# Patient Record
Sex: Female | Born: 1960 | Race: White | Hispanic: No | Marital: Married | Smoking: Never smoker
Health system: Southern US, Community
[De-identification: ages and names within clinical notes are randomized; demographics above are authoritative.]

## PROBLEM LIST (undated history)

## (undated) DIAGNOSIS — G9050 Complex regional pain syndrome I, unspecified: Secondary | ICD-10-CM

## (undated) DIAGNOSIS — I7300 Raynaud's syndrome without gangrene: Secondary | ICD-10-CM

## (undated) HISTORY — PX: OTHER SURGICAL HISTORY: SHX169

## (undated) HISTORY — DX: Complex regional pain syndrome I, unspecified: G90.50

## (undated) HISTORY — DX: Raynaud's syndrome without gangrene: I73.00

## (undated) HISTORY — PX: BREAST SURGERY: SHX581

## (undated) HISTORY — PX: DILATION AND CURETTAGE OF UTERUS: SHX78

---

## 1997-04-17 ENCOUNTER — Other Ambulatory Visit: Admission: RE | Admit: 1997-04-17 | Discharge: 1997-04-17 | Payer: Self-pay | Admitting: Obstetrics and Gynecology

## 1998-08-19 ENCOUNTER — Emergency Department (HOSPITAL_COMMUNITY): Admission: EM | Admit: 1998-08-19 | Discharge: 1998-08-20 | Payer: Self-pay | Admitting: Emergency Medicine

## 1998-08-20 ENCOUNTER — Encounter: Payer: Self-pay | Admitting: Emergency Medicine

## 2001-04-15 ENCOUNTER — Other Ambulatory Visit: Admission: RE | Admit: 2001-04-15 | Discharge: 2001-04-15 | Payer: Self-pay | Admitting: Obstetrics and Gynecology

## 2001-04-22 ENCOUNTER — Encounter: Payer: Self-pay | Admitting: Obstetrics and Gynecology

## 2001-04-22 ENCOUNTER — Encounter: Admission: RE | Admit: 2001-04-22 | Discharge: 2001-04-22 | Payer: Self-pay | Admitting: Obstetrics and Gynecology

## 2001-05-30 ENCOUNTER — Emergency Department (HOSPITAL_COMMUNITY): Admission: EM | Admit: 2001-05-30 | Discharge: 2001-05-31 | Payer: Self-pay | Admitting: Emergency Medicine

## 2001-05-31 ENCOUNTER — Encounter: Payer: Self-pay | Admitting: Emergency Medicine

## 2002-04-10 ENCOUNTER — Encounter: Payer: Self-pay | Admitting: General Surgery

## 2002-04-10 ENCOUNTER — Encounter: Admission: RE | Admit: 2002-04-10 | Discharge: 2002-04-10 | Payer: Self-pay | Admitting: General Surgery

## 2002-05-29 ENCOUNTER — Emergency Department (HOSPITAL_COMMUNITY): Admission: EM | Admit: 2002-05-29 | Discharge: 2002-05-29 | Payer: Self-pay | Admitting: Emergency Medicine

## 2002-05-31 ENCOUNTER — Emergency Department (HOSPITAL_COMMUNITY): Admission: EM | Admit: 2002-05-31 | Discharge: 2002-05-31 | Payer: Self-pay | Admitting: Emergency Medicine

## 2003-04-05 ENCOUNTER — Emergency Department (HOSPITAL_COMMUNITY): Admission: EM | Admit: 2003-04-05 | Discharge: 2003-04-06 | Payer: Self-pay | Admitting: Emergency Medicine

## 2004-04-03 ENCOUNTER — Emergency Department: Payer: Self-pay | Admitting: General Practice

## 2004-04-21 ENCOUNTER — Emergency Department (HOSPITAL_COMMUNITY): Admission: EM | Admit: 2004-04-21 | Discharge: 2004-04-21 | Payer: Self-pay | Admitting: Emergency Medicine

## 2004-06-25 ENCOUNTER — Emergency Department: Payer: Self-pay | Admitting: Emergency Medicine

## 2005-09-05 IMAGING — CR DG ABDOMEN ACUTE W/ 1V CHEST
4 series · 4 of 4 positions shown · non-contrast
Comparison: none

CLINICAL DATA: Abdominal pain.
3-VIEW ACUTE ABDOMINAL SERIES WITH CHEST:

[w chest pa]
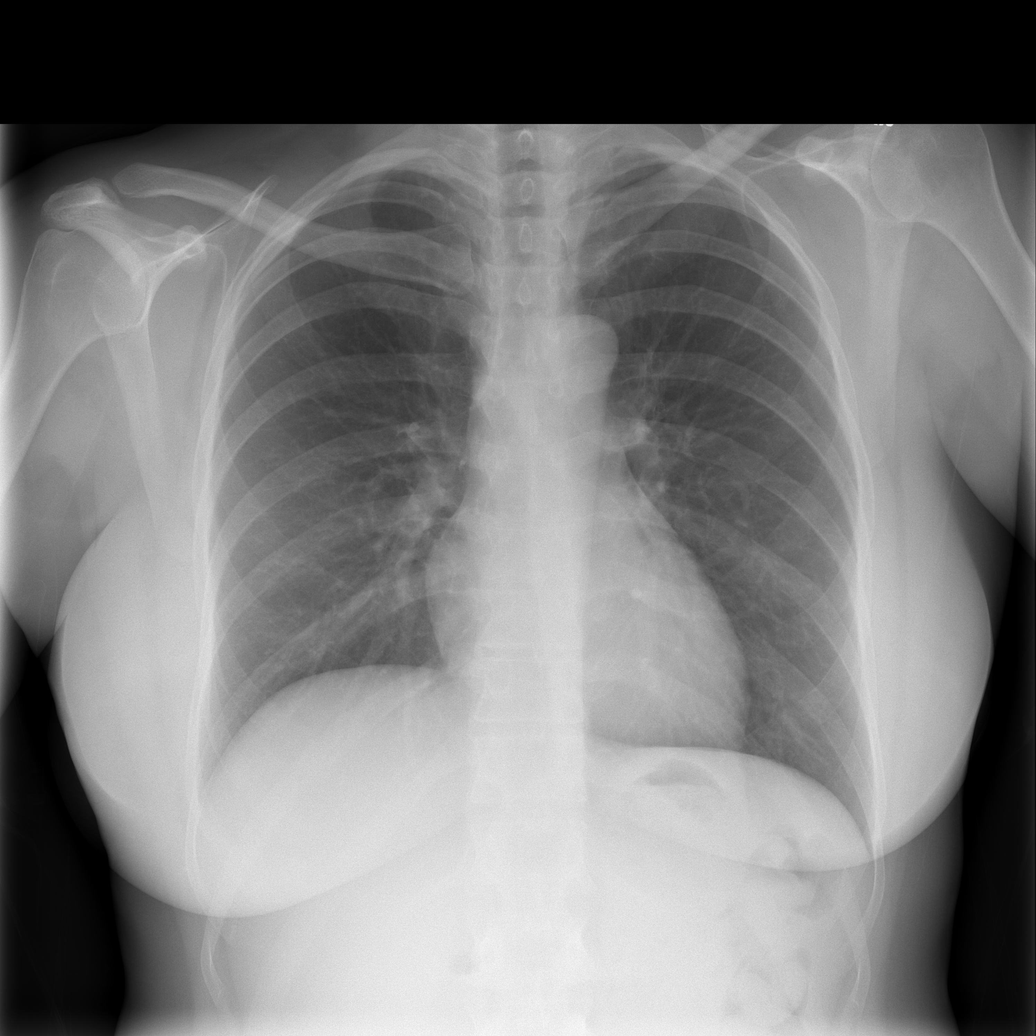

[w abdomen upright *]
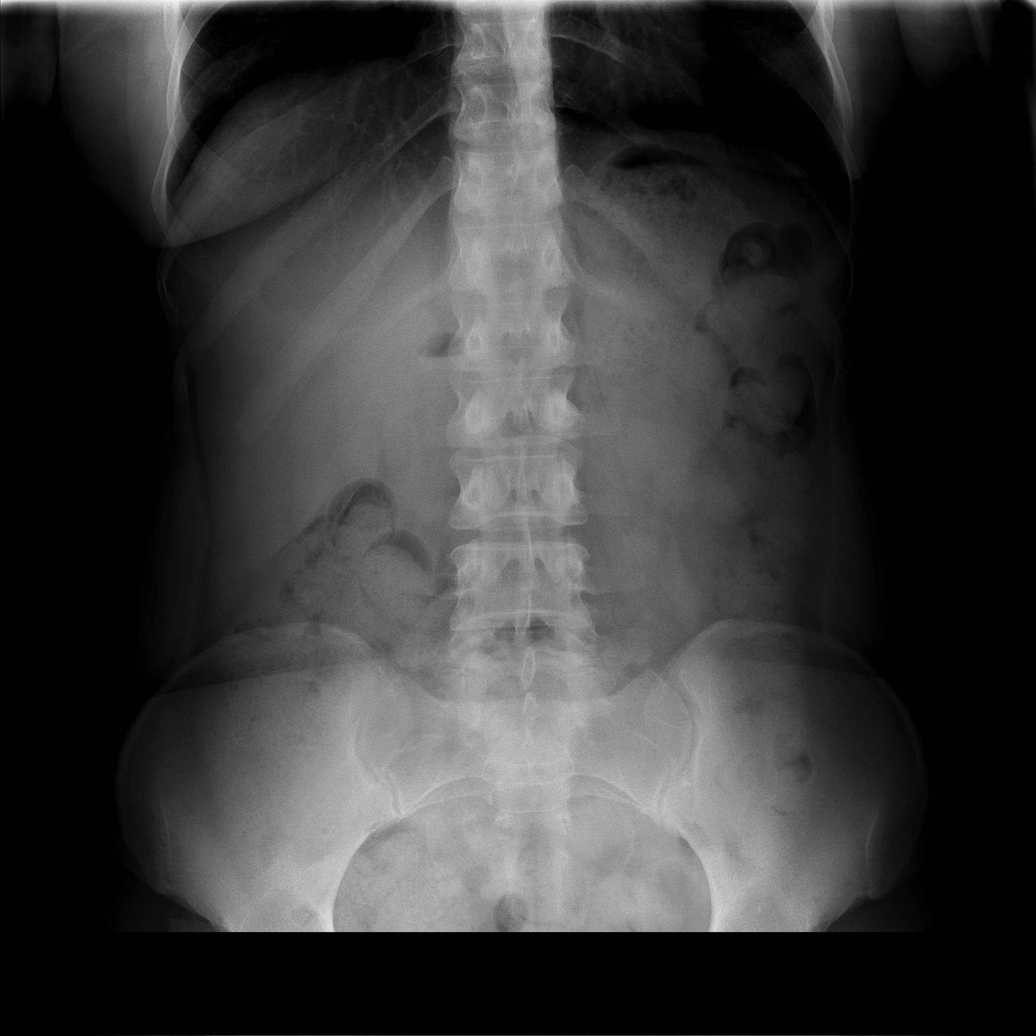

[t abdomen supine (1 of 2)]
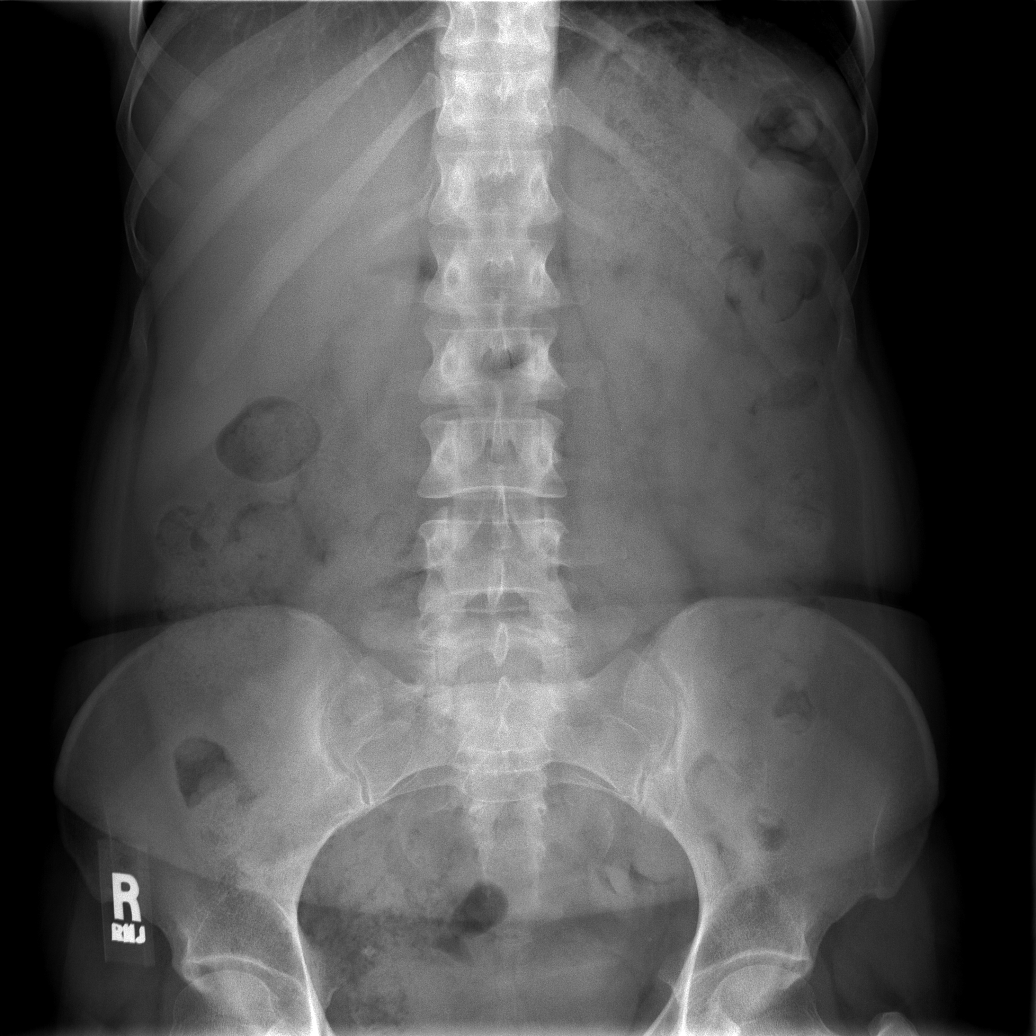

[t abdomen supine (2 of 2)]
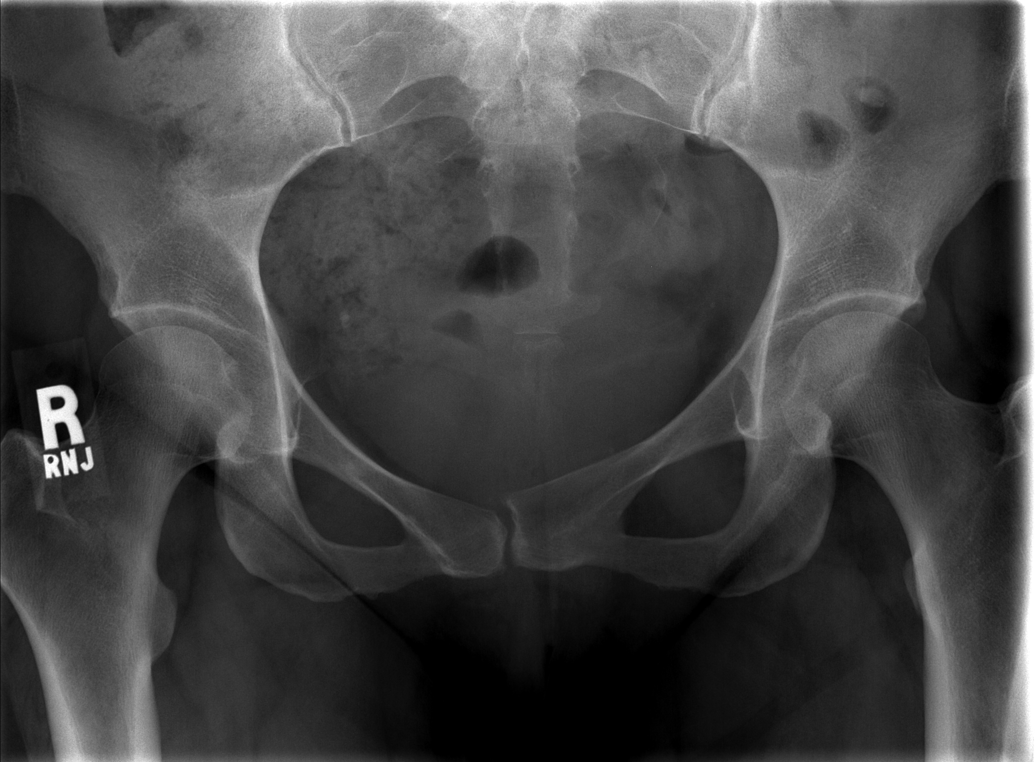

[4 of 4 positions shown; findings below may reference images not displayed]

FINDINGS: ardiomediastinal silhouette is unremarkable.  The lungs are clear.  Unremarkable bowel gas pattern with moderate colonic stool.  No evidence of bowel obstruction or pneumoperitoneum.
IMPRESSION: Moderate colonic stool. Otherwise no acute abnormality.

## 2006-09-12 ENCOUNTER — Encounter: Admission: RE | Admit: 2006-09-12 | Discharge: 2006-09-12 | Payer: Self-pay | Admitting: General Surgery

## 2006-12-20 ENCOUNTER — Other Ambulatory Visit: Admission: RE | Admit: 2006-12-20 | Discharge: 2006-12-20 | Payer: Self-pay | Admitting: Gynecology

## 2008-09-18 ENCOUNTER — Other Ambulatory Visit: Admission: RE | Admit: 2008-09-18 | Discharge: 2008-09-18 | Payer: Self-pay | Admitting: Gynecology

## 2008-09-18 ENCOUNTER — Encounter: Payer: Self-pay | Admitting: Gynecology

## 2008-09-18 ENCOUNTER — Ambulatory Visit: Payer: Self-pay | Admitting: Gynecology

## 2008-09-22 ENCOUNTER — Ambulatory Visit: Payer: Self-pay | Admitting: Gynecology

## 2008-09-25 ENCOUNTER — Ambulatory Visit: Payer: Self-pay | Admitting: Gynecology

## 2008-12-04 ENCOUNTER — Encounter: Admission: RE | Admit: 2008-12-04 | Discharge: 2008-12-04 | Payer: Self-pay | Admitting: Gynecology

## 2009-10-06 ENCOUNTER — Ambulatory Visit: Payer: Self-pay | Admitting: Gynecology

## 2009-10-06 ENCOUNTER — Encounter: Admission: RE | Admit: 2009-10-06 | Discharge: 2009-10-06 | Payer: Self-pay | Admitting: Gynecology

## 2009-10-06 ENCOUNTER — Other Ambulatory Visit: Admission: RE | Admit: 2009-10-06 | Discharge: 2009-10-06 | Payer: Self-pay | Admitting: Gynecology

## 2010-01-24 ENCOUNTER — Encounter: Payer: Self-pay | Admitting: Gynecology

## 2010-09-15 ENCOUNTER — Telehealth: Payer: Self-pay | Admitting: *Deleted

## 2010-09-15 NOTE — Telephone Encounter (Signed)
Pt called stating she needs a mammogram screening i give pt [facility] to schedule appointment.

## 2010-09-16 ENCOUNTER — Telehealth: Payer: Self-pay | Admitting: *Deleted

## 2010-09-16 NOTE — Telephone Encounter (Signed)
Pt called stating she has issues with the iud. Lm for pt to call

## 2010-10-10 ENCOUNTER — Encounter: Payer: Self-pay | Admitting: Gynecology

## 2010-10-10 DIAGNOSIS — I7300 Raynaud's syndrome without gangrene: Secondary | ICD-10-CM | POA: Insufficient documentation

## 2010-10-13 ENCOUNTER — Ambulatory Visit (INDEPENDENT_AMBULATORY_CARE_PROVIDER_SITE_OTHER): Payer: Managed Care, Other (non HMO) | Admitting: Gynecology

## 2010-10-13 ENCOUNTER — Other Ambulatory Visit (HOSPITAL_COMMUNITY)
Admission: RE | Admit: 2010-10-13 | Discharge: 2010-10-13 | Disposition: A | Discharge status: Home / Self Care | Payer: Managed Care, Other (non HMO) | Source: Ambulatory Visit | Attending: Gynecology | Admitting: Gynecology

## 2010-10-13 ENCOUNTER — Encounter: Payer: Self-pay | Admitting: Gynecology

## 2010-10-13 VITALS — BP 132/84 | Ht 69.0 in | Wt 160.0 lb

## 2010-10-13 DIAGNOSIS — N760 Acute vaginitis: Secondary | ICD-10-CM

## 2010-10-13 DIAGNOSIS — B373 Candidiasis of vulva and vagina: Secondary | ICD-10-CM

## 2010-10-13 DIAGNOSIS — A499 Bacterial infection, unspecified: Secondary | ICD-10-CM

## 2010-10-13 DIAGNOSIS — Z131 Encounter for screening for diabetes mellitus: Secondary | ICD-10-CM

## 2010-10-13 DIAGNOSIS — Z01419 Encounter for gynecological examination (general) (routine) without abnormal findings: Secondary | ICD-10-CM

## 2010-10-13 DIAGNOSIS — N898 Other specified noninflammatory disorders of vagina: Secondary | ICD-10-CM

## 2010-10-13 DIAGNOSIS — R82998 Other abnormal findings in urine: Secondary | ICD-10-CM

## 2010-10-13 DIAGNOSIS — N949 Unspecified condition associated with female genital organs and menstrual cycle: Secondary | ICD-10-CM

## 2010-10-13 DIAGNOSIS — Z13220 Encounter for screening for lipoid disorders: Secondary | ICD-10-CM

## 2010-10-13 DIAGNOSIS — N938 Other specified abnormal uterine and vaginal bleeding: Secondary | ICD-10-CM

## 2010-10-13 MED ORDER — METRONIDAZOLE 500 MG PO TABS: 500.0000 mg | ORAL_TABLET | Freq: Two times a day (BID) | ORAL | Status: AC

## 2010-10-13 MED ORDER — TERCONAZOLE 0.4 % VA CREA: 1.0000 | TOPICAL_CREAM | Freq: Every day | VAGINAL | Status: AC

## 2010-10-13 NOTE — Progress Notes (Signed)
Tammie Russell 28-Sep-1960 [id number]        50 y.o.  for annual exam.  Mirena IUD contraception. She notes that the last 3 months spotting almost on a daily basis. She's worn tampons daily for the past 3 months patient notes also cracking around the entrance to her vagina which is making intercourse difficult.  Past medical history,surgical history, medications, allergies, family history and social history were all reviewed and documented in the EPIC chart. ROS:  Was performed and pertinent positives and negatives are included in the history.  Exam: chaperone present Filed Vitals:   10/13/10 1410  BP: 132/84   General appearance  Normal Skin grossly normal Head/Neck normal with no cervical or supraclavicular adenopathy thyroid normal Lungs  clear Cardiac RR, without RMG Abdominal  soft, nontender, without masses, organomegaly or hernia Breasts  examined lying and sitting without masses, retractions, discharge or axillary adenopathy. Pelvic  Ext/BUS/vagina  normal with white discharge noted, KOH wet prep done  Cervix  normal  IUD string visualized, Pap smear done  Uterus  anteverted, normal size, shape and contour, midline and mobile nontender   Adnexa  Without masses or tenderness    Anus and perineum  normal with external hemorrhoids noted  Rectovaginal  Deferred at patient request the to her external hemorrhoids.    Assessment/Plan:  50 y.o. female for annual exam.    1. Dub. I think her spotting is due to chronic yeast and BV which was positive on her wet prep. Also daily use of tampons for 3 months certainly is irritative. I asked her to stop using the tampons and to use Terazol 7 cream nightly for a week and Flagyl 500 twice a day x7 days alcohol avoidance reviewed. If her spotting continues she knows to call and will start with ultrasound, look at the IUD and endometrial echo. I also ordered a TSH. If spotting clears then will follow expectantly. 2. IUD.  Was placed September  2010. Doing well with this from contraceptive standpoint we'll continue through the 5 your use. 3. Health maintenance. Self breast exams on a monthly basis discussed and urged. She has mammography scheduled today we'll follow for this. Baseline CBC urinalysis glucose lipid profile ordered. Assuming #1 resolves then we'll follow up annually.    Dara Lords MD, 3:08 PM 10/13/2010

## 2010-10-13 NOTE — Patient Instructions (Signed)
Use medications as prescribed.  

## 2010-10-14 ENCOUNTER — Telehealth: Payer: Self-pay | Admitting: Gynecology

## 2010-10-14 DIAGNOSIS — N390 Urinary tract infection, site not specified: Secondary | ICD-10-CM

## 2010-10-14 MED ORDER — SULFAMETHOXAZOLE-TMP DS 800-160 MG PO TABS: 1.0000 | ORAL_TABLET | Freq: Two times a day (BID) | ORAL | Status: AC

## 2010-10-14 NOTE — Telephone Encounter (Signed)
Pt called saying pharmacy never got Terazol and flagyl Rx. Both Rx called in to [facility] pharmacy. Pt informed of this and lab results from last office visit.

## 2010-10-14 NOTE — Telephone Encounter (Signed)
Pt informed with the below note, rx called in to [facility] pharmacy.

## 2010-10-14 NOTE — Telephone Encounter (Signed)
Tell patient cholesterol and LDL are elevated and I want her to check a fasting lipid profile. She also had some bacteria grow in her urine and I would cover her with a short course of antibiotics in addition to which she is taking for the vaginal infection. Septra DS 1 by mouth twice a day x3 days ordered.

## 2010-10-14 NOTE — Telephone Encounter (Signed)
Lm for pt to call

## 2010-10-17 ENCOUNTER — Telehealth: Payer: Self-pay | Admitting: *Deleted

## 2010-10-17 NOTE — Telephone Encounter (Signed)
Pt called stating her 3 rx given on recent office visit is working great. She did have a question about the Terazol cream. Pt said that it made her feel very damp and was it normal that some of the cream leaks out of her vagina. Pt informed that this is a normal feeling and that some of the cream will come out due to body temperature. Pt was okay with this, she will follow up if any discharge should recur after taking the rx prescribed.

## 2010-10-18 ENCOUNTER — Telehealth: Payer: Self-pay | Admitting: *Deleted

## 2010-10-18 NOTE — Telephone Encounter (Signed)
Pt called with question about flagyl & septra ds. All questions answered for pt.

## 2010-10-21 ENCOUNTER — Encounter: Payer: Self-pay | Admitting: Gynecology

## 2010-10-27 ENCOUNTER — Encounter: Payer: Self-pay | Admitting: Gynecology

## 2010-10-27 ENCOUNTER — Ambulatory Visit (INDEPENDENT_AMBULATORY_CARE_PROVIDER_SITE_OTHER): Payer: Managed Care, Other (non HMO) | Admitting: Gynecology

## 2010-10-27 VITALS — BP 120/82 | Wt 157.0 lb

## 2010-10-27 DIAGNOSIS — B373 Candidiasis of vulva and vagina: Secondary | ICD-10-CM

## 2010-10-27 DIAGNOSIS — N898 Other specified noninflammatory disorders of vagina: Secondary | ICD-10-CM

## 2010-10-27 MED ORDER — FLUCONAZOLE 200 MG PO TABS: 200.0000 mg | ORAL_TABLET | Freq: Every day | ORAL | Status: AC

## 2010-10-27 NOTE — Progress Notes (Signed)
Patient presents in follow up from recent visit to being treated for BV and yeast causing a discharge and some spotting. Her symptoms have totally resolved she feels well and just wanted it checked to make sure.  Exam Pelvic external BUS vagina normal cervix normal bimanual uterus normal size midline mobile nontender adnexa without masses or tenderness  Wet prep positive for yeast  Assessment and plan resolving vulvovaginitis still has some yeast we'll cover with Diflucan 200 daily x3 days we'll follow if she develops any symptoms or recurrence.

## 2010-10-28 ENCOUNTER — Other Ambulatory Visit: Payer: Self-pay | Admitting: Dermatology

## 2010-11-03 ENCOUNTER — Encounter: Payer: Self-pay | Admitting: Family Medicine

## 2010-11-03 ENCOUNTER — Other Ambulatory Visit (INDEPENDENT_AMBULATORY_CARE_PROVIDER_SITE_OTHER): Payer: Managed Care, Other (non HMO)

## 2010-11-03 DIAGNOSIS — A35 Other tetanus: Secondary | ICD-10-CM

## 2010-11-03 DIAGNOSIS — Z23 Encounter for immunization: Secondary | ICD-10-CM

## 2011-02-13 ENCOUNTER — Telehealth: Payer: Self-pay | Admitting: *Deleted

## 2011-02-13 NOTE — Telephone Encounter (Signed)
Pt called with vaginal irritation and vaginal tears requested pills over the phone. I advised with new protocol needs OV since hasnt been seen since Oct 2012. Pt said she would treat over the counter.

## 2011-12-21 ENCOUNTER — Other Ambulatory Visit (HOSPITAL_COMMUNITY)
Admission: RE | Admit: 2011-12-21 | Discharge: 2011-12-21 | Disposition: A | Discharge status: Home / Self Care | Payer: Managed Care, Other (non HMO) | Source: Ambulatory Visit | Attending: Gynecology | Admitting: Gynecology

## 2011-12-21 ENCOUNTER — Encounter: Payer: Self-pay | Admitting: Gynecology

## 2011-12-21 ENCOUNTER — Ambulatory Visit (INDEPENDENT_AMBULATORY_CARE_PROVIDER_SITE_OTHER): Payer: Managed Care, Other (non HMO) | Admitting: Gynecology

## 2011-12-21 VITALS — BP 124/76 | Ht 69.0 in | Wt 145.0 lb

## 2011-12-21 DIAGNOSIS — Z1151 Encounter for screening for human papillomavirus (HPV): Secondary | ICD-10-CM | POA: Insufficient documentation

## 2011-12-21 DIAGNOSIS — Z13220 Encounter for screening for lipoid disorders: Secondary | ICD-10-CM

## 2011-12-21 DIAGNOSIS — Z01419 Encounter for gynecological examination (general) (routine) without abnormal findings: Secondary | ICD-10-CM | POA: Insufficient documentation

## 2011-12-21 DIAGNOSIS — R634 Abnormal weight loss: Secondary | ICD-10-CM

## 2011-12-21 LAB — COMPREHENSIVE METABOLIC PANEL
ALT: 14 U/L (ref 0–35)
AST: 16 U/L (ref 0–37)
Alkaline Phosphatase: 52 U/L (ref 39–117)
Sodium: 142 mEq/L (ref 135–145)
Total Bilirubin: 0.6 mg/dL (ref 0.3–1.2)
Total Protein: 7.1 g/dL (ref 6.0–8.3)

## 2011-12-21 LAB — CBC WITH DIFFERENTIAL/PLATELET
Basophils Relative: 0 % (ref 0–1)
Eosinophils Absolute: 0 10*3/uL (ref 0.0–0.7)
Lymphs Abs: 1.8 10*3/uL (ref 0.7–4.0)
MCH: 28.7 pg (ref 26.0–34.0)
MCHC: 34.2 g/dL (ref 30.0–36.0)
Neutrophils Relative %: 56 % (ref 43–77)
Platelets: 239 10*3/uL (ref 150–400)
RBC: 4.32 MIL/uL (ref 3.87–5.11)

## 2011-12-21 LAB — LIPID PANEL
LDL Cholesterol: 110 mg/dL — ABNORMAL HIGH (ref 0–99)
VLDL: 12 mg/dL (ref 0–40)

## 2011-12-21 NOTE — Patient Instructions (Signed)
Check My Chart web site for lab results Follow up in one year for annual exam

## 2011-12-21 NOTE — Progress Notes (Signed)
Tammie Russell 1960/12/30 [id number]        51 y.o.  [id number] for annual exam.  Several issues noted below.  Past medical history,surgical history, medications, allergies, family history and social history were all reviewed and documented in the EPIC chart. ROS:  Was performed and pertinent positives and negatives are included in the history.  Exam: Sherrilyn Rist assistant Filed Vitals:   12/21/11 1506  BP: 124/76  Height: 5\' 9"  (1.753 m)  Weight: 145 lb (65.772 kg)   General appearance  Normal Skin grossly normal Head/Neck normal with no cervical or supraclavicular adenopathy thyroid normal Lungs  clear Cardiac RR, without RMG Abdominal  soft, nontender, without masses, organomegaly or hernia Breasts  examined lying and sitting without masses, retractions, discharge or axillary adenopathy. Pelvic  Ext/BUS/vagina  normal   Cervix  normal Pap/HPV  Uterus  converted, normal size, shape and contour, midline and mobile nontender   Adnexa  Without masses or tenderness    Anus and perineum  normal   Rectovaginal  normal sphincter tone without palpated masses or tenderness.    Assessment/Plan:  51 y.o. [id number] female for annual exam.   1. Mirena IUD. Placed 09/2008. Doing well with no bleeding. Continue to monitor. Not having flushes night sweats or other menopausal symptoms.  Check baseline FSH now to see where we stand. 2. History weight loss. Patient reports losing 30 pounds over the last 1-2 years and not trying. Is concerned that something bad is going on. No hair or skin changes rashes night sweats adenopathy or other constitutional symptoms such as diarrhea constipation. Admits to stressful time over the past 6 months or so and is attributed her weight loss to this.  We'll check baseline TSH comprehensive metabolic panel CBC HIV urinalysis along with lipid profile. If labs all normal and weight stabilizes will follow. She will continue to lose weight or develops symptoms I recommended she see a  medical doctor for further evaluation and she agrees. 3. Mammography. Patient has scheduled today.  SBE monthly reviewed. 4. Pap smear 10/2010.  No history of significant abnormalities. Options for less frequent screening reviewed. The patient is very concerned and requests having a Pap smear this year. Pap/HPV done. 5. Colonoscopy. Patient had normal colonoscopy approximately 9 years ago. Will repeat next year a 10 year interval. 6. Health maintenance. Follow up on labs as ordered above. If normal and continues well and follow. If weight loss continues she will see a primary medical doctor for further evaluation.    Dara Lords MD, 4:49 PM 12/21/2011

## 2011-12-22 ENCOUNTER — Encounter: Payer: Self-pay | Admitting: Gynecology

## 2011-12-22 LAB — VITAMIN D 25 HYDROXY (VIT D DEFICIENCY, FRACTURES): Vit D, 25-Hydroxy: 10 ng/mL — ABNORMAL LOW (ref 30–89)

## 2011-12-22 LAB — URINALYSIS W MICROSCOPIC + REFLEX CULTURE
Leukocytes, UA: NEGATIVE
Protein, ur: NEGATIVE mg/dL
Urobilinogen, UA: 0.2 mg/dL (ref 0.0–1.0)

## 2011-12-22 LAB — FOLLICLE STIMULATING HORMONE: FSH: 56.7 m[IU]/mL

## 2011-12-22 LAB — HIV ANTIBODY (ROUTINE TESTING W REFLEX): HIV: NONREACTIVE

## 2011-12-29 ENCOUNTER — Encounter: Payer: Self-pay | Admitting: Gynecology

## 2012-03-29 ENCOUNTER — Other Ambulatory Visit: Payer: Self-pay | Admitting: *Deleted

## 2012-03-29 DIAGNOSIS — E559 Vitamin D deficiency, unspecified: Secondary | ICD-10-CM

## 2012-04-09 ENCOUNTER — Telehealth: Payer: Self-pay | Admitting: Gynecology

## 2012-04-09 ENCOUNTER — Telehealth: Payer: Self-pay | Admitting: Obstetrics & Gynecology

## 2012-04-09 NOTE — Telephone Encounter (Signed)
Dr Hyacinth Meeker called office stating patient had call her with a problem  since she was the doctor on call. Dr Hyacinth Meeker wants patient to follow up with Dr Audie Box. Left message this morning for patient to call and make appointment.

## 2012-04-09 NOTE — Telephone Encounter (Signed)
Patient reports having an IUD for both birth control and for bleeding control.  Woke up this morning about 4am and felt cold and wet.  Went to bathroom and passed two large clots.  Bleeding was heavy for a few hours them subsided.  Felt just like a miscarriage to her.  H/o this in the past.  When she called through answering service around 7am, her bleeding had decreased significantly.  She denied dizziness or lightheadedness.  Has not done a pregnancy test.  Gave very graphic description of clots and bleeding.  Refused going to the ER.  Wants to see Dr. Audie Box ONLY.

## 2012-07-29 ENCOUNTER — Telehealth: Payer: Self-pay | Admitting: Gynecology

## 2012-07-29 NOTE — Telephone Encounter (Signed)
Patient called late evening July 27 stating that she had exposed her IUD and was mucous blood-tinged. She was not having any pain. She was not sure what she needed to do with the IUD. She stated that she was going to put in a plastic bag and bring it to the office with her appointment. Patient had stated that she had a miscarriage sometime in February with the IUD in place? Review of her records indicated that she had called the office and the doctor on call on April 8 reassured her to make an appointment to be seen in our office the next morning and she was contacted and message was left to schedule appointment the patient did not call back. I had promised her that our office would call her the next day to schedule an appointment for followup with Dr. Audie Box who is her primary physician.

## 2012-07-30 ENCOUNTER — Telehealth: Payer: Self-pay | Admitting: Gynecology

## 2012-07-30 ENCOUNTER — Telehealth: Payer: Self-pay | Admitting: Family Medicine

## 2012-07-30 NOTE — Telephone Encounter (Signed)
SHE HAS MULTIPLE ISSUES AND NEEDS EXTENDED APPT TIME

## 2012-07-30 NOTE — Telephone Encounter (Signed)
Dr. Glenetta Hew wanted it know that this pt of Dr. Velvet Bathe called late Sunday night on 07/28/12 with a problem & he advised her to call to see Dr. Velvet Bathe on Monday 07/29/12. I left a message for pt to call back to set up this appt per Dr. Glenetta Hew and she never did return the call and has yet to call in to be seen.WL

## 2012-08-01 ENCOUNTER — Telehealth: Payer: Self-pay | Admitting: Family Medicine

## 2012-08-01 ENCOUNTER — Ambulatory Visit (INDEPENDENT_AMBULATORY_CARE_PROVIDER_SITE_OTHER): Payer: Managed Care, Other (non HMO) | Admitting: Family Medicine

## 2012-08-01 ENCOUNTER — Encounter: Payer: Self-pay | Admitting: Family Medicine

## 2012-08-01 VITALS — BP 110/70 | HR 70 | Wt 142.0 lb

## 2012-08-01 DIAGNOSIS — E559 Vitamin D deficiency, unspecified: Secondary | ICD-10-CM

## 2012-08-01 DIAGNOSIS — N920 Excessive and frequent menstruation with regular cycle: Secondary | ICD-10-CM

## 2012-08-01 LAB — CBC WITH DIFFERENTIAL/PLATELET
Basophils Absolute: 0 10*3/uL (ref 0.0–0.1)
Eosinophils Relative: 0 % (ref 0–5)
Lymphocytes Relative: 38 % (ref 12–46)
Lymphs Abs: 1.8 10*3/uL (ref 0.7–4.0)
MCV: 80.9 fL (ref 78.0–100.0)
Neutro Abs: 2.8 10*3/uL (ref 1.7–7.7)
Neutrophils Relative %: 57 % (ref 43–77)
Platelets: 225 10*3/uL (ref 150–400)
RBC: 4.29 MIL/uL (ref 3.87–5.11)
RDW: 15 % (ref 11.5–15.5)
WBC: 4.9 10*3/uL (ref 4.0–10.5)

## 2012-08-01 NOTE — Progress Notes (Signed)
  Subjective:    Patient ID: Tammie Russell, female    DOB: February 05, 1960, 52 y.o.   MRN: [id number]  HPI She is here for evaluation of multiple complaints. It was very difficult for her to stay on task and she tended to ramble every time I ask a question. She would give me a narrative rather than answering the question. Apparently she has had a great deal of difficulty with menstrual bleeding and has seen her gynecologist in the past concerning this. She complains of weakness, continued bleeding. She then changed the subject and talked about something that occurred in 2006. She then changes subject and talked about some that occurred in 2010. Her main complaint seems to be weakness, headache and weight loss. Review of record indicates she has discussed this with her gynecologist. Record also indicated that she had called their office for least twice and followup upon was recommended that she did not keep. Apparently on light 27th her IUD fell out. Since then she has had some intermittent bleeding but recently it had slowed down. Review of record also indicates she is vitamin D deficient and was told to take a vitamin D supplement however she states that she has not and did not remember getting any phone call.  Review of Systems     Objective:   Physical Exam alert and in no distress. Tympanic membranes and canals are normal. Throat is clear. Tonsils are normal. Neck is supple without adenopathy or thyromegaly. Cardiac exam shows a regular sinus rhythm without murmurs or gallops. Lungs are clear to auscultation. Pelvic exam does show a small amount of blood present. The uterus did seem to be slightly larger than expected but no masses. Cervix was slightly tender to manipulation. Fingerstick hemoglobin 8.7. Postural signs were negative     Assessment & Plan:  Vitamin D deficiency - Plan: Vitamin D 25 hydroxy  Menorrhagia - Plan: CBC with Differential, Comprehensive metabolic panel  he was very  difficult to carry on a conversation with her and she change in the subject and rambling. Explained to her on several occasions as I think it would be best for her to return to her gynecologist for followup on this but did blood work. She then called back and complain to the front office that nothing was done. I talked to her on the phone and again she rambled on for about 5 minutes saying the same thing she said before and brought up several other complaints she had. I eventually had to hang up on her because I could not get her to understand that it did indeed do blood work as well as an exam.

## 2012-08-01 NOTE — Patient Instructions (Signed)
Take 800 mg of ibuprofen 3 times per day. 

## 2012-08-01 NOTE — Telephone Encounter (Signed)
DR Susann Givens TALKED TO PT

## 2012-08-02 LAB — COMPREHENSIVE METABOLIC PANEL
ALT: 12 U/L (ref 0–35)
AST: 15 U/L (ref 0–37)
CO2: 27 mEq/L (ref 19–32)
Calcium: 9.2 mg/dL (ref 8.4–10.5)
Chloride: 103 mEq/L (ref 96–112)
Creat: 0.62 mg/dL (ref 0.50–1.10)
Sodium: 140 mEq/L (ref 135–145)
Total Protein: 7.1 g/dL (ref 6.0–8.3)

## 2012-08-02 NOTE — Telephone Encounter (Signed)
Dr Susann Givens had a long conversation with pt regarding her expectations and Dr Susann Givens explained what would happen next with her care. He tried to calm the patient some.

## 2013-02-03 ENCOUNTER — Other Ambulatory Visit: Payer: Self-pay | Admitting: Internal Medicine

## 2013-02-03 ENCOUNTER — Ambulatory Visit (INDEPENDENT_AMBULATORY_CARE_PROVIDER_SITE_OTHER): Payer: BC Managed Care – PPO | Admitting: Internal Medicine

## 2013-02-03 VITALS — BP 154/82 | HR 78 | Temp 97.7°F | Resp 18 | Ht 69.0 in | Wt 136.0 lb

## 2013-02-03 DIAGNOSIS — F341 Dysthymic disorder: Secondary | ICD-10-CM

## 2013-02-03 DIAGNOSIS — R5381 Other malaise: Secondary | ICD-10-CM

## 2013-02-03 DIAGNOSIS — F329 Major depressive disorder, single episode, unspecified: Secondary | ICD-10-CM

## 2013-02-03 DIAGNOSIS — O039 Complete or unspecified spontaneous abortion without complication: Secondary | ICD-10-CM

## 2013-02-03 DIAGNOSIS — J019 Acute sinusitis, unspecified: Secondary | ICD-10-CM

## 2013-02-03 DIAGNOSIS — R5383 Other fatigue: Secondary | ICD-10-CM

## 2013-02-03 DIAGNOSIS — N920 Excessive and frequent menstruation with regular cycle: Secondary | ICD-10-CM

## 2013-02-03 DIAGNOSIS — Z8759 Personal history of other complications of pregnancy, childbirth and the puerperium: Secondary | ICD-10-CM | POA: Insufficient documentation

## 2013-02-03 LAB — POCT CBC
Granulocyte percent: 62.8 % (ref 37–80)
HCT, POC: 40.2 % (ref 37.7–47.9)
Hemoglobin: 12.7 g/dL (ref 12.2–16.2)
Lymph, poc: 1.8 (ref 0.6–3.4)
MCH, POC: 28.7 pg (ref 27–31.2)
MCHC: 31.6 g/dL — AB (ref 31.8–35.4)
MCV: 90.9 fL (ref 80–97)
MID (cbc): 0.3 (ref 0–0.9)
MPV: 9.6 fL (ref 0–99.8)
POC Granulocyte: 3.5 (ref 2–6.9)
POC LYMPH PERCENT: 32.7 % (ref 10–50)
POC MID %: 4.5 % (ref 0–12)
Platelet Count, POC: 235 K/uL (ref 142–424)
RBC: 4.42 M/uL (ref 4.04–5.48)
RDW, POC: 14.3 %
WBC: 5.6 K/uL (ref 4.6–10.2)

## 2013-02-03 MED ORDER — ALPRAZOLAM 0.5 MG PO TABS: 0.5000 mg | ORAL_TABLET | Freq: Two times a day (BID) | ORAL | Status: AC | PRN

## 2013-02-03 MED ORDER — LEVOFLOXACIN 500 MG PO TABS: 500.0000 mg | ORAL_TABLET | Freq: Every day | ORAL | Status: DC
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
## 2013-02-04 ENCOUNTER — Other Ambulatory Visit: Payer: Self-pay | Admitting: Radiology

## 2013-02-04 DIAGNOSIS — O209 Hemorrhage in early pregnancy, unspecified: Secondary | ICD-10-CM

## 2013-02-04 LAB — COMPREHENSIVE METABOLIC PANEL
ALBUMIN: 4.4 g/dL (ref 3.5–5.2)
ALK PHOS: 51 U/L (ref 39–117)
ALT: 63 U/L — ABNORMAL HIGH (ref 0–35)
AST: 38 U/L — ABNORMAL HIGH (ref 0–37)
BILIRUBIN TOTAL: 0.2 mg/dL (ref 0.2–1.2)
BUN: 18 mg/dL (ref 6–23)
CO2: 30 meq/L (ref 19–32)
Calcium: 9.2 mg/dL (ref 8.4–10.5)
Chloride: 107 mEq/L (ref 96–112)
Creat: 0.53 mg/dL (ref 0.50–1.10)
GLUCOSE: 91 mg/dL (ref 70–99)
POTASSIUM: 4.3 meq/L (ref 3.5–5.3)
SODIUM: 143 meq/L (ref 135–145)
TOTAL PROTEIN: 6.9 g/dL (ref 6.0–8.3)

## 2013-02-04 LAB — T4, FREE: FREE T4: 1.11 ng/dL (ref 0.80–1.80)

## 2013-02-04 LAB — HCG, QUANTITATIVE, PREGNANCY: HCG, BETA CHAIN, QUANT, S: 2.3 m[IU]/mL

## 2013-02-04 LAB — TSH: TSH: 1.52 u[IU]/mL (ref 0.350–4.500)

## 2013-02-04 NOTE — Addendum Note (Signed)
Addended by: Tonye PearsonOLITTLE, Siearra Amberg P on: 02/04/2013 05:56 PM   Modules accepted: Orders

## 2013-02-05 ENCOUNTER — Encounter: Payer: Self-pay | Admitting: Internal Medicine

## 2013-02-05 ENCOUNTER — Other Ambulatory Visit: Payer: Self-pay | Admitting: Radiology

## 2013-02-05 ENCOUNTER — Telehealth: Payer: Self-pay | Admitting: Family Medicine

## 2013-02-05 DIAGNOSIS — O209 Hemorrhage in early pregnancy, unspecified: Secondary | ICD-10-CM

## 2013-02-05 LAB — HEPATITIS C ANTIBODY: HCV AB: NEGATIVE

## 2013-02-05 NOTE — Telephone Encounter (Signed)
Please look at patients labs she's upset that know one has called her she has an appointment tomorrow morning at 7:30 and feels like if her labs is good she doesn't need to have the ultra sound

## 2013-02-05 NOTE — Telephone Encounter (Signed)
Spoke with patient her labs is normal Cancell appointment to get ultra sound and to keep appointment with Dr Lovett Tammie Russell OBGYN return Sunday if not better patient understands

## 2013-02-06 ENCOUNTER — Ambulatory Visit (HOSPITAL_COMMUNITY): Admission: RE | Admit: 2013-02-06 | Payer: Managed Care, Other (non HMO) | Source: Ambulatory Visit

## 2013-02-06 ENCOUNTER — Telehealth: Payer: Self-pay | Admitting: *Deleted

## 2013-02-06 NOTE — Telephone Encounter (Signed)
Please call her ---serum HCG negative so miscarriage complete US next and f/u sun as planned Setting up new GYN as well

## 2013-02-06 NOTE — Telephone Encounter (Signed)
LMOM to return call.

## 2013-02-06 NOTE — Telephone Encounter (Signed)
Pt received a call to cancel US and to keep appt with GYN

## 2013-02-12 ENCOUNTER — Other Ambulatory Visit: Payer: Self-pay | Admitting: Internal Medicine

## 2014-11-17 ENCOUNTER — Encounter: Payer: Self-pay | Admitting: Women's Health

## 2014-12-22 ENCOUNTER — Encounter: Payer: BLUE CROSS/BLUE SHIELD | Admitting: Women's Health

## 2014-12-30 ENCOUNTER — Encounter: Payer: Self-pay | Admitting: Women's Health

## 2014-12-30 ENCOUNTER — Other Ambulatory Visit (HOSPITAL_COMMUNITY)
Admission: RE | Admit: 2014-12-30 | Discharge: 2014-12-30 | Disposition: A | Discharge status: Home / Self Care | Payer: BLUE CROSS/BLUE SHIELD | Source: Ambulatory Visit | Attending: Women's Health | Admitting: Women's Health

## 2014-12-30 ENCOUNTER — Ambulatory Visit (INDEPENDENT_AMBULATORY_CARE_PROVIDER_SITE_OTHER): Payer: BLUE CROSS/BLUE SHIELD | Admitting: Women's Health

## 2014-12-30 VITALS — BP 150/80 | Ht 69.0 in | Wt 168.0 lb

## 2014-12-30 DIAGNOSIS — N841 Polyp of cervix uteri: Secondary | ICD-10-CM | POA: Diagnosis not present

## 2014-12-30 DIAGNOSIS — Z01419 Encounter for gynecological examination (general) (routine) without abnormal findings: Secondary | ICD-10-CM | POA: Insufficient documentation

## 2014-12-30 DIAGNOSIS — Z13220 Encounter for screening for lipoid disorders: Secondary | ICD-10-CM

## 2014-12-30 DIAGNOSIS — Z1151 Encounter for screening for human papillomavirus (HPV): Secondary | ICD-10-CM | POA: Insufficient documentation

## 2014-12-30 DIAGNOSIS — Z23 Encounter for immunization: Secondary | ICD-10-CM

## 2014-12-30 DIAGNOSIS — Z1329 Encounter for screening for other suspected endocrine disorder: Secondary | ICD-10-CM | POA: Diagnosis not present

## 2014-12-30 LAB — CBC WITH DIFFERENTIAL/PLATELET
BASOS PCT: 1 % (ref 0–1)
Basophils Absolute: 0.1 10*3/uL (ref 0.0–0.1)
EOS ABS: 0 10*3/uL (ref 0.0–0.7)
EOS PCT: 0 % (ref 0–5)
HCT: 35.7 % — ABNORMAL LOW (ref 36.0–46.0)
Hemoglobin: 11.8 g/dL — ABNORMAL LOW (ref 12.0–15.0)
LYMPHS ABS: 1.5 10*3/uL (ref 0.7–4.0)
Lymphocytes Relative: 23 % (ref 12–46)
MCH: 28.3 pg (ref 26.0–34.0)
MCHC: 33.1 g/dL (ref 30.0–36.0)
MCV: 85.6 fL (ref 78.0–100.0)
MPV: 10.1 fL (ref 8.6–12.4)
Monocytes Absolute: 0.3 10*3/uL (ref 0.1–1.0)
Monocytes Relative: 4 % (ref 3–12)
NEUTROS PCT: 72 % (ref 43–77)
Neutro Abs: 4.8 10*3/uL (ref 1.7–7.7)
PLATELETS: 232 10*3/uL (ref 150–400)
RBC: 4.17 MIL/uL (ref 3.87–5.11)
RDW: 14 % (ref 11.5–15.5)
WBC: 6.6 10*3/uL (ref 4.0–10.5)

## 2014-12-30 LAB — COMPREHENSIVE METABOLIC PANEL
ALT: 33 U/L — ABNORMAL HIGH (ref 6–29)
AST: 35 U/L (ref 10–35)
Albumin: 4.2 g/dL (ref 3.6–5.1)
Alkaline Phosphatase: 62 U/L (ref 33–130)
BUN: 14 mg/dL (ref 7–25)
CO2: 27 mmol/L (ref 20–31)
CREATININE: 0.53 mg/dL (ref 0.50–1.05)
Calcium: 9.2 mg/dL (ref 8.6–10.4)
Chloride: 105 mmol/L (ref 98–110)
GLUCOSE: 92 mg/dL (ref 65–99)
POTASSIUM: 4 mmol/L (ref 3.5–5.3)
SODIUM: 144 mmol/L (ref 135–146)
TOTAL PROTEIN: 6.8 g/dL (ref 6.1–8.1)
Total Bilirubin: 0.3 mg/dL (ref 0.2–1.2)

## 2014-12-30 LAB — LIPID PANEL
CHOL/HDL RATIO: 3.2 ratio (ref <=5.0)
CHOLESTEROL: 203 mg/dL — AB (ref 125–200)
HDL: 64 mg/dL (ref >=46)
LDL Cholesterol: 117 mg/dL (ref <130)
Triglycerides: 111 mg/dL (ref <150)
VLDL: 22 mg/dL (ref <30)

## 2014-12-30 LAB — TSH: TSH: 1.425 u[IU]/mL (ref 0.350–4.500)

## 2014-12-30 NOTE — Progress Notes (Signed)
Tammie Russell [facility] Center[facility] August 26, 1960 [id number]    History:    Presents for annual exam.  Last annual exam 3 years ago. Has  numerous complaints relating to weight gain, situational stress, poor marital relationship. Reports home life is stressful due to ongoing long-term renovations in bathroom and kitchen, no hot water. Normal Pap and mammogram history. Has not had a screening colonoscopy. Monthly cycle. Spontaneous loss of Mirena IUD August 2014. Using no contraception. Rare intercourse. Husband has had a negative STD screen. Had pictures on her phone of her incompleted bathroom and kitchen, pictures when  Slimmer.  Past medical history, past surgical history, family history and social history were all reviewed and documented in the EPIC chart. Poor marital relationship, has had no counseling. Estranged from [place] 2 children. [place]   ROS:  A ROS was performed and pertinent positives and negatives are included.  Exam:  Filed Vitals:   12/30/14 1129  BP: 150/80    General appearance:  Normal Thyroid:  Symmetrical, normal in size, without palpable masses or nodularity. Respiratory  Auscultation:  Clear without wheezing or rhonchi Cardiovascular  Auscultation:  Regular rate, without rubs, murmurs or gallops  Edema/varicosities:  Not grossly evident Abdominal  Soft,nontender, without masses, guarding or rebound.  Liver/spleen:  No organomegaly noted  Hernia:  None appreciated  Skin  Inspection:  Grossly normal   Breasts: Examined lying and sitting.     Right: Without masses, retractions, discharge or axillary adenopathy.     Left: Without masses, retractions, discharge or axillary adenopathy. Gentitourinary   Inguinal/mons:  Normal without inguinal adenopathy  External genitalia:  Normal  BUS/Urethra/Skene's glands:  Normal  Vagina:  Normal  Cervix:  Normal half centimeter cervical polyp, removed sent for pathology  Uterus:   normal in size, shape and contour.  Midline and  mobile  Adnexa/parametria:     Rt: Without masses or tenderness.   Lt: Without masses or tenderness.  Anus and perineum: Normal  Digital rectal exam: Normal sphincter tone without palpated masses or tenderness  Assessment/Plan:  54 y.o. MWF G5 P2 for annual exam.    Monthly cycle/no contraception/birth sexual activity Cervical polyp Anxiety-Situational stress/anxiety and depression Reported weight gain Overdue for annual screenings Elevated blood pressure  Plan: Reviewed importance of annual mammogram, 3-D tomography reviewed and encouraged history of dense breast. Reviewed importance of regular exercise, calcium rich diet, over-the-counter vitamin D 1000 daily encouraged. Strongly encouraged counseling. Raynelle FanningJulie Whitt's name and number was given instructed to schedule. Instructed to follow-up with primary care related to blood pressure states always low, was rushing to appointment and is stressed. CBC, CMP, lipid panel, TSH, vitamin D, UA, Pap with HR HPV typing. Instructed to schedule screening colonoscopy, [facility] information given and reviewed. Instructed to call if any  spotting, reviewed would need to proceed with a Jettie Boozesonohysterogram.  [name],Tammie Russell AL[facility] CenterWHNP, 2:54 PM 12/30/2014

## 2014-12-30 NOTE — Patient Instructions (Signed)

## 2014-12-31 LAB — URINALYSIS W MICROSCOPIC + REFLEX CULTURE
Bacteria, UA: NONE SEEN [HPF]
Bilirubin Urine: NEGATIVE
CASTS: NONE SEEN [LPF]
GLUCOSE, UA: NEGATIVE
HGB URINE DIPSTICK: NEGATIVE
KETONES UR: NEGATIVE
Leukocytes, UA: NEGATIVE
NITRITE: NEGATIVE
Protein, ur: NEGATIVE
RBC / HPF: NONE SEEN RBC/HPF (ref <=2)
Specific Gravity, Urine: 1.024 (ref 1.001–1.035)
Squamous Epithelial / LPF: NONE SEEN [HPF] (ref <=5)
WBC, UA: NONE SEEN WBC/HPF (ref <=5)
YEAST: NONE SEEN [HPF]
pH: 5.5 (ref 5.0–8.0)

## 2014-12-31 LAB — VITAMIN D 25 HYDROXY (VIT D DEFICIENCY, FRACTURES): VIT D 25 HYDROXY: 33 ng/mL (ref 30–100)

## 2015-01-01 LAB — CYTOLOGY - PAP

## 2015-01-04 ENCOUNTER — Other Ambulatory Visit: Payer: Self-pay | Admitting: Women's Health

## 2015-01-04 DIAGNOSIS — R87618 Other abnormal cytological findings on specimens from cervix uteri: Secondary | ICD-10-CM

## 2015-01-19 ENCOUNTER — Other Ambulatory Visit: Payer: Self-pay | Admitting: Women's Health

## 2015-01-19 MED ORDER — DIAZEPAM 5 MG PO TABS: ORAL_TABLET | ORAL | Status: AC

## 2023-08-23 ENCOUNTER — Other Ambulatory Visit: Payer: Self-pay

## 2023-08-23 ENCOUNTER — Encounter: Payer: Self-pay | Admitting: *Deleted

## 2023-08-23 DIAGNOSIS — R0602 Shortness of breath: Secondary | ICD-10-CM | POA: Insufficient documentation

## 2023-08-23 DIAGNOSIS — Z5321 Procedure and treatment not carried out due to patient leaving prior to being seen by health care provider: Secondary | ICD-10-CM | POA: Diagnosis not present

## 2023-08-23 DIAGNOSIS — R079 Chest pain, unspecified: Secondary | ICD-10-CM | POA: Diagnosis not present

## 2023-08-23 DIAGNOSIS — M546 Pain in thoracic spine: Secondary | ICD-10-CM | POA: Diagnosis not present

## 2023-08-23 NOTE — ED Triage Notes (Addendum)
 Pt to triage via wheelchair.  Pt reports sob since 2030 tonight.  Pt also reports chest pain.  No n/v/  pt anxious pt alert  speech clear. Pt has pain in mid back area.  Pt reports she is on list for a nerve block.  Pt is refusing blood pressure, blood work and EKG in triage.  Pt is loud and tearful at time.   Pt leaning over in wheelchair while husband is rubbing her mid back area.

## 2023-08-23 NOTE — ED Notes (Addendum)
 Attempted to get BP on pt, pt stated that she needed it be a manual BP cuff. Pt was informed that we do not have them in triage. This NT asked pt if we could get an EKG at this time and pt stated that she was hurting too bad at this time to perform one. Pt is tearful and loud.

## 2023-08-24 ENCOUNTER — Emergency Department
Admission: EM | Admit: 2023-08-24 | Discharge: 2023-08-24 | Discharge status: Against Medical Advice | Attending: Emergency Medicine | Admitting: Emergency Medicine
